# Patient Record
Sex: Male | Born: 1975 | Race: Black or African American | Hispanic: No | Marital: Single | Smoking: Current every day smoker
Health system: Southern US, Community
[De-identification: ages and names within clinical notes are randomized; demographics above are authoritative.]

---

## 2020-02-20 ENCOUNTER — Emergency Department (HOSPITAL_COMMUNITY)
Admission: EM | Admit: 2020-02-20 | Discharge: 2020-02-20 | Disposition: A | Discharge status: Home / Self Care | Payer: PRIVATE HEALTH INSURANCE | Attending: Emergency Medicine | Admitting: Emergency Medicine

## 2020-02-20 ENCOUNTER — Emergency Department (HOSPITAL_COMMUNITY): Payer: PRIVATE HEALTH INSURANCE

## 2020-02-20 ENCOUNTER — Encounter (HOSPITAL_COMMUNITY): Payer: Self-pay

## 2020-02-20 ENCOUNTER — Other Ambulatory Visit: Payer: Self-pay

## 2020-02-20 DIAGNOSIS — L089 Local infection of the skin and subcutaneous tissue, unspecified: Secondary | ICD-10-CM | POA: Diagnosis not present

## 2020-02-20 DIAGNOSIS — K0889 Other specified disorders of teeth and supporting structures: Secondary | ICD-10-CM | POA: Diagnosis present

## 2020-02-20 DIAGNOSIS — Y999 Unspecified external cause status: Secondary | ICD-10-CM | POA: Diagnosis not present

## 2020-02-20 DIAGNOSIS — S62653A Nondisplaced fracture of medial phalanx of left middle finger, initial encounter for closed fracture: Secondary | ICD-10-CM | POA: Diagnosis not present

## 2020-02-20 DIAGNOSIS — Y9389 Activity, other specified: Secondary | ICD-10-CM | POA: Insufficient documentation

## 2020-02-20 DIAGNOSIS — K047 Periapical abscess without sinus: Secondary | ICD-10-CM | POA: Insufficient documentation

## 2020-02-20 DIAGNOSIS — S62651A Nondisplaced fracture of medial phalanx of left index finger, initial encounter for closed fracture: Secondary | ICD-10-CM | POA: Diagnosis not present

## 2020-02-20 DIAGNOSIS — X58XXXA Exposure to other specified factors, initial encounter: Secondary | ICD-10-CM | POA: Insufficient documentation

## 2020-02-20 DIAGNOSIS — Y929 Unspecified place or not applicable: Secondary | ICD-10-CM | POA: Insufficient documentation

## 2020-02-20 MED ORDER — TRAMADOL HCL 50 MG PO TABS
50.0000 mg | ORAL_TABLET | Freq: Once | ORAL | Status: AC
  Administered 2020-02-20: 50 mg via ORAL
  Filled 2020-02-20: qty 1

## 2020-02-20 MED ORDER — AMOXICILLIN-POT CLAVULANATE 875-125 MG PO TABS
1.0000 | ORAL_TABLET | Freq: Once | ORAL | Status: AC
  Administered 2020-02-20: 1 via ORAL
  Filled 2020-02-20: qty 1

## 2020-02-20 MED ORDER — AMOXICILLIN-POT CLAVULANATE 875-125 MG PO TABS
1.0000 | ORAL_TABLET | Freq: Two times a day (BID) | ORAL | 0 refills | Status: DC
Start: 2020-02-20 — End: 2021-11-27

## 2020-02-20 NOTE — ED Triage Notes (Addendum)
Pt reports rash that has spread from lower left arm to his neck that started about a week and a half ago. Pt also endorses right sided dental pain that started 2-3 days ago.

## 2020-02-20 NOTE — Discharge Instructions (Addendum)
As discussed, you have been diagnosed with a skin infection, a dental infection, and a broken para fingers on her left hand. In regards to the skin infection, please follow-up with your physician.  In regards to the dental infection, please use the provided resources to ensure follow-up with a dentist.  In regards to the finger fractures it is very portly follow-up with her orthopedic colleagues within 1 week for repeat evaluation.  Return here for concerning changes in your condition.

## 2020-02-20 NOTE — ED Provider Notes (Signed)
[facility]-EMERGENCY DEPT Provider Note   CSN: [id number] Arrival date & time: 02/20/20  2031     History Chief Complaint  Patient presents with  . Dental Pain  . Rash    Austin Elliott is a 44 y.o. male.  HPI    Patient presents with multiple complaints. Seems as though his primary complaint is right facial pain, swelling.  Secondary complaint is diffuse skin lesions.  Third complaint is pain about the second and third digit on the left hand. He notes that for the past 3 or 4 days he has had increasing pain, swelling, about his right face. He has known dental issues in the right lower jaw.  He has no difficulty swallowing, speaking.  However, with increasing pain, swelling throughout the right posterior lateral face, extending into his neck he presents for evaluation. He denies fever, vomiting, nausea.  In regard to the patient's secondary complaint he notes that for about 1 week he has had widely scattered small skin lesions.  Initially these were on his arms, but subsequently spread to the rest of his torso. 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  Since that time they have been itchy, spreading, uncomfortable.  There is no confluent erythema throughout them.  In regard to the patient's third complaint he notes that about 2 weeks ago he injured his left hand, since that time he has had pain, swelling in the proximal interphalangeal joint of the second and third digits.  He is incapable of fully flexing or extending either of the digits. It is unclear if the patient has been taking any medication for pain control for any of the aforementioned maladies.  History reviewed. No pertinent past medical history.  There are no problems to display for this patient.   History reviewed. No pertinent surgical history.     History reviewed. No pertinent family history.  Social History   Tobacco Use  . Smoking status: Not on file    Substance Use Topics  . Alcohol use: Not on file  . Drug use: Not on file    Home Medications Prior to Admission medications   Not on File    Allergies    Patient has no allergy information on record.  Review of Systems   Review of Systems  Constitutional:       Per HPI, otherwise negative  HENT:       Per HPI, otherwise negative  Respiratory:       Per HPI, otherwise negative  Cardiovascular:       Per HPI, otherwise negative  Gastrointestinal: Negative for vomiting.  Endocrine:       Negative aside from HPI  Genitourinary:       Neg aside from HPI   Musculoskeletal:       Per HPI, otherwise negative  Skin: Positive for rash.  Neurological: Negative for syncope.    Physical Exam Updated Vital Signs BP (!) 167/115 (BP Location: Left Arm)   Pulse 61   Temp 98.7 F (37.1 C) (Oral)   Resp 18   Ht 5\' 6"  (1.676 m)   Wt 99.8 kg   SpO2 98%   BMI 35.51 kg/m   Physical Exam Vitals and nursing note reviewed.  Constitutional:      General: He is not in acute distress.    Appearance: He is well-developed.  HENT:     Head: Normocephalic and atraumatic.      Mouth/Throat:  Eyes:     Conjunctiva/sclera: Conjunctivae normal.  Cardiovascular:     Rate and Rhythm: Normal rate and regular rhythm.  Pulmonary:     Effort: Pulmonary effort is normal. No respiratory distress.     Breath sounds: No stridor.  Abdominal:     General: There is no distension.  Musculoskeletal:       Arms:  Skin:    General: Skin is warm and dry.     Comments: Diffuse less than 0.5 cm scattered lesions without confluent erythema.  Neurological:     Mental Status: He is alert and oriented to person, place, and time.     ED Results / Procedures / Treatments    Radiology DG Hand Complete Left  Result Date: 02/20/2020 CLINICAL DATA:  Pain to the second digits. EXAM: LEFT HAND - COMPLETE 3+ VIEW COMPARISON:  None. FINDINGS: There are acute appearing bilateral volar plate injuries  involving the base of the middle phalanges of the second and third digits. There is surrounding soft tissue swelling. There is no dislocation. IMPRESSION: Acute appearing volar plate avulsion fractures involving the proximal interphalangeal joints of the second and third digits. Electronically Signed   By: Constance Holster M.D.   On: 02/20/2020 22:29    Procedures Procedures (including critical care time)  Medications Ordered in ED Medications  amoxicillin-clavulanate (AUGMENTIN) 875-125 MG per tablet 1 tablet (1 tablet Oral Given 02/20/20 2225)  traMADol (ULTRAM) tablet 50 mg (50 mg Oral Given 02/20/20 2225)    ED Course  I have reviewed the triage vital signs and the nursing notes.  Pertinent labs & imaging results that were available during my care of the patient were reviewed by me and considered in my medical decision making (see chart for details).    MDM Rules/Calculators/A&P                      11:07 PM On repeat exam patient is awake, alert, standing upright. And reviewed the findings with him, after reviewing the images myself, interpreting.  Patient found to have 2 individual fractures in digits on the left hand.  Patient is requiring splinting, immobilization, orthopedic follow-up in this regard. In regard to his other complaints there is no evidence for airway compromise, bacteremia, sepsis, there is suspicion for both dental infection and cutaneous infection. In total, the patient will require analgesia, antibiotics, splinting, but is appropriate for outpatient follow-up in all regards. Final Clinical Impression(s) / ED Diagnoses Final diagnoses:  Dental infection  Closed nondisplaced fracture of middle phalanx of left index finger, initial encounter  Closed nondisplaced fracture of middle phalanx of left middle finger, initial encounter  Skin infection    Rx / DC Orders ED Discharge Orders         Ordered    amoxicillin-clavulanate (AUGMENTIN) 875-125 MG tablet   Every 12 hours     02/20/20 2312           Carmin Muskrat, MD 02/20/20 2312

## 2020-04-11 ENCOUNTER — Emergency Department (HOSPITAL_COMMUNITY)
Admission: EM | Admit: 2020-04-11 | Discharge: 2020-04-11 | Disposition: A | Discharge status: Home / Self Care | Payer: PRIVATE HEALTH INSURANCE | Attending: Emergency Medicine | Admitting: Emergency Medicine

## 2020-04-11 ENCOUNTER — Other Ambulatory Visit: Payer: Self-pay

## 2020-04-11 ENCOUNTER — Emergency Department (HOSPITAL_COMMUNITY): Payer: PRIVATE HEALTH INSURANCE

## 2020-04-11 ENCOUNTER — Encounter (HOSPITAL_COMMUNITY): Payer: Self-pay

## 2020-04-11 DIAGNOSIS — Y999 Unspecified external cause status: Secondary | ICD-10-CM | POA: Insufficient documentation

## 2020-04-11 DIAGNOSIS — M79642 Pain in left hand: Secondary | ICD-10-CM | POA: Insufficient documentation

## 2020-04-11 DIAGNOSIS — Y929 Unspecified place or not applicable: Secondary | ICD-10-CM | POA: Insufficient documentation

## 2020-04-11 DIAGNOSIS — Y939 Activity, unspecified: Secondary | ICD-10-CM | POA: Insufficient documentation

## 2020-04-11 DIAGNOSIS — W19XXXA Unspecified fall, initial encounter: Secondary | ICD-10-CM

## 2020-04-11 DIAGNOSIS — M79645 Pain in left finger(s): Secondary | ICD-10-CM | POA: Insufficient documentation

## 2020-04-11 DIAGNOSIS — W010XXA Fall on same level from slipping, tripping and stumbling without subsequent striking against object, initial encounter: Secondary | ICD-10-CM | POA: Insufficient documentation

## 2020-04-11 MED ORDER — NAPROXEN 500 MG PO TABS
500.0000 mg | ORAL_TABLET | Freq: Once | ORAL | Status: AC
  Administered 2020-04-11: 500 mg via ORAL
  Filled 2020-04-11: qty 1

## 2020-04-11 NOTE — ED Triage Notes (Signed)
Pt states a few days ago pt fell on his left hand, hurting his left index and middle finger. Pt able to move fingers. Pt states he wants them "checked out".

## 2020-04-11 NOTE — Discharge Instructions (Signed)
Take tylenol and Ibuprofen as needed for pain. Follow up with hand surgery for new or worsening symptoms

## 2020-04-11 NOTE — ED Provider Notes (Signed)
[facility]-EMERGENCY DEPT Provider Note   CSN: [id number] Arrival date & time: 04/11/20  1132     History Chief Complaint  Patient presents with   Left Hand Injury    Austin Elliott is a 44 y.o. male with this a past medical history who presents for evaluation of left hand pain.  Patient states 3 days ago he had a mechanical trip and fall.  He did not complete, ground however patient states his second and third digit on his left upper extremity and his wrist have been bothering him since he fell.  He states he has had some soft tissue swelling which is significantly resolved since initial incident however continues to have some pain.  He has no bony tenderness to his radius or ulna.  He denies hitting his head, LOC or anticoagulation.  No contusions, abrasions, lacerations.  Denies any IV drug use.  Denies fever, chills, nausea, vomiting, chest pain, shortness of breath, edema, erythema, warmth, paresthesias.  Denies additional aggravating or relieving factors.  History obtained from patient and past medical records. No interpreter used.  HPI     History reviewed. No pertinent past medical history.  There are no problems to display for this patient.   History reviewed. No pertinent surgical history.     History reviewed. No pertinent family history.  Social History   Tobacco Use   Smoking status: Not on file  Substance Use Topics   Alcohol use: Not on file   Drug use: Not on file    Home Medications Prior to Admission medications   Medication Sig Start Date End Date Taking? Authorizing Provider  amoxicillin-clavulanate (AUGMENTIN) 875-125 MG tablet Take 1 tablet by mouth every 12 (twelve) hours. 02/20/20   Gerhard Munch, MD    Allergies    Patient has no known allergies.  Review of Systems   Review of Systems  Constitutional: Negative.   HENT: Negative.   Respiratory: Negative.   Cardiovascular: Negative.   Gastrointestinal: Negative.    Genitourinary: Negative.   Musculoskeletal:       Left hand pain  Skin: Negative.   Neurological: Negative.   All other systems reviewed and are negative.   Physical Exam Updated Vital Signs BP (!) 144/93    Pulse 61    Temp 98.4 F (36.9 C) (Oral)    Resp 18    SpO2 100%   Physical Exam Vitals and nursing note reviewed.  Constitutional:      General: He is not in acute distress.    Appearance: He is well-developed. He is not ill-appearing, toxic-appearing or diaphoretic.  HENT:     Head: Normocephalic and atraumatic.     Nose: Nose normal.     Mouth/Throat:     Mouth: Mucous membranes are moist.     Pharynx: Oropharynx is clear.  Eyes:     Pupils: Pupils are equal, round, and reactive to light.  Cardiovascular:     Rate and Rhythm: Normal rate and regular rhythm.     Pulses: Normal pulses.     Heart sounds: Normal heart sounds.  Pulmonary:     Effort: Pulmonary effort is normal. No respiratory distress.     Breath sounds: Normal breath sounds.  Abdominal:     General: Bowel sounds are normal. There is no distension.     Palpations: Abdomen is soft.     Tenderness: There is no abdominal tenderness. There is no right CVA tenderness, left CVA tenderness, guarding or rebound.  Musculoskeletal:  General: Normal range of motion.     Right shoulder: Normal.     Left shoulder: Normal.     Right upper arm: Normal.     Left upper arm: Normal.     Right elbow: Normal.     Left elbow: Normal.     Right forearm: Normal.     Left forearm: Normal.     Right wrist: Normal.     Left wrist: Normal.     Right hand: Normal.     Left hand: Tenderness present. No swelling, lacerations or bony tenderness. Normal range of motion. Normal strength.       Hands:     Cervical back: Normal range of motion and neck supple.     Comments: Moves all  4 extremities without difficulty.  No bony tenderness to bilateral scaphoid.  No bony tenderness to radius or ulna.  Some tenderness to  his second and third digit on his left upper extremity without any overlying skin changes.  Able to flex and extend at left upper extremity.  Able to pronate and supinate.  Skin:    General: Skin is warm and dry.     Capillary Refill: Capillary refill takes less than 2 seconds.     Comments: Brisk capillary refill.  No edema, erythema or warmth.  No fluctuance or induration.  No contusions, abrasions or lacerations  Neurological:     Mental Status: He is alert.     Comments: 5/5 strength bilateral upper extremities at difficulty Intact sensation.     ED Results / Procedures / Treatments   Labs (all labs ordered are listed, but only abnormal results are displayed) Labs Reviewed - No data to display  EKG None  Radiology DG Wrist Complete Left  Result Date: 04/11/2020 CLINICAL DATA:  Left wrist pain after fall. EXAM: LEFT WRIST - COMPLETE 3+ VIEW COMPARISON:  None. FINDINGS: There is no evidence of fracture or dislocation. There is no evidence of arthropathy or other focal bone abnormality. Soft tissues are unremarkable. IMPRESSION: Negative. Electronically Signed   By: Lupita Raider M.D.   On: 04/11/2020 12:26   DG Hand Complete Left  Result Date: 04/11/2020 CLINICAL DATA:  Left hand pain after fall several days ago. EXAM: LEFT HAND - COMPLETE 3+ VIEW COMPARISON:  None. FINDINGS: There is no evidence of fracture or dislocation. There is no evidence of arthropathy or other focal bone abnormality. Soft tissues are unremarkable. IMPRESSION: Negative. Electronically Signed   By: Lupita Raider M.D.   On: 04/11/2020 12:25    Procedures .Ortho Injury Treatment  Date/Time: 04/11/2020 1:16 PM Performed by: Ralph Leyden A, PA-C Authorized by: Linwood Dibbles, PA-C   Consent:    Consent obtained:  Verbal   Consent given by:  Patient   Risks discussed:  Fracture, nerve damage, restricted joint movement, vascular damage, stiffness, recurrent dislocation and irreducible  dislocation   Alternatives discussed:  No treatment, alternative treatment, immobilization, referral and delayed treatmentInjury location: hand Location details: left hand Injury type: soft tissue Pre-procedure neurovascular assessment: neurovascularly intact Pre-procedure distal perfusion: normal Pre-procedure neurological function: normal Pre-procedure range of motion: normal  Anesthesia: Local anesthesia used: no  Patient sedated: NoImmobilization: brace Post-procedure neurovascular assessment: post-procedure neurovascularly intact Post-procedure distal perfusion: normal Post-procedure neurological function: normal Post-procedure range of motion: normal Patient tolerance: patient tolerated the procedure well with no immediate complications    (including critical care time)  Medications Ordered in ED Medications  naproxen (NAPROSYN) tablet 500 mg (500  mg Oral Given 04/11/20 1256)    ED Course  I have reviewed the triage vital signs and the nursing notes.  Pertinent labs & imaging results that were available during my care of the patient were reviewed by me and considered in my medical decision making (see chart for details).  44 year old presents for evaluation mechanical fall.  Had fall 2-3 days ago.  He denies hitting his head, LOC or anticoagulation.  Patient with left hand pain since the incident.  He is neurovascularly intact.  He has normal musculoskeletal exam.  Initially had some swelling over his second and third metacarpals and digits which has significantly resolved.  No contusions or abrasions.  Plan on x-ray and reassess.  Xray without acute findings.   Buddy taped fingers for comfort. Splint wrist. Low suspicion for acute tendon or ligament injury as well as bony injury given he has full range of motion. Low suspicion for acute tendon, ligament or bony injury given he has full range motion and is neurovascularly intact. We will have him follow-up with surgery if he  continues to have pain. Low suspicion for septic joint, gout, hemarthrosis, vascular occlusion, bacterial infectious process.  The patient has been appropriately medically screened and/or stabilized in the ED. I have low suspicion for any other emergent medical condition which would require further screening, evaluation or treatment in the ED or require inpatient management.  Patient is hemodynamically stable and in no acute distress.  Patient able to ambulate in department prior to ED.  Evaluation does not show acute pathology that would require ongoing or additional emergent interventions while in the emergency department or further inpatient treatment.  I have discussed the diagnosis with the patient and answered all questions.  Pain is been managed while in the emergency department and patient has no further complaints prior to discharge.  Patient is comfortable with plan discussed in room and is stable for discharge at this time.  I have discussed strict return precautions for returning to the emergency department.  Patient was encouraged to follow-up with PCP/specialist refer to at discharge.    MDM Rules/Calculators/A&P                           Final Clinical Impression(s) / ED Diagnoses Final diagnoses:  Fall, initial encounter  Pain of left hand    Rx / DC Orders ED Discharge Orders    None       Rai Severns A, PA-C 04/11/20 1317    Terrilee Files, MD 04/11/20 1755

## 2020-04-11 NOTE — ED Notes (Signed)
An After Visit Summary was printed and given to the patient. Discharge instructions given and no further questions at this time.  

## 2020-08-18 ENCOUNTER — Emergency Department (HOSPITAL_COMMUNITY)
Admission: EM | Admit: 2020-08-18 | Discharge: 2020-08-18 | Disposition: A | Discharge status: Home / Self Care | Payer: Self-pay | Attending: Emergency Medicine | Admitting: Emergency Medicine

## 2020-08-18 ENCOUNTER — Emergency Department (HOSPITAL_COMMUNITY): Payer: Self-pay

## 2020-08-18 ENCOUNTER — Other Ambulatory Visit: Payer: Self-pay

## 2020-08-18 ENCOUNTER — Encounter (HOSPITAL_COMMUNITY): Payer: Self-pay

## 2020-08-18 DIAGNOSIS — F17200 Nicotine dependence, unspecified, uncomplicated: Secondary | ICD-10-CM | POA: Insufficient documentation

## 2020-08-18 DIAGNOSIS — R1010 Upper abdominal pain, unspecified: Secondary | ICD-10-CM | POA: Insufficient documentation

## 2020-08-18 DIAGNOSIS — R110 Nausea: Secondary | ICD-10-CM | POA: Insufficient documentation

## 2020-08-18 DIAGNOSIS — M549 Dorsalgia, unspecified: Secondary | ICD-10-CM | POA: Insufficient documentation

## 2020-08-18 LAB — COMPREHENSIVE METABOLIC PANEL
ALT: 39 U/L (ref 0–44)
AST: 27 U/L (ref 15–41)
Albumin: 4.1 g/dL (ref 3.5–5.0)
Alkaline Phosphatase: 62 U/L (ref 38–126)
Anion gap: 9 (ref 5–15)
BUN: 6 mg/dL (ref 6–20)
CO2: 25 mmol/L (ref 22–32)
Calcium: 9 mg/dL (ref 8.9–10.3)
Chloride: 103 mmol/L (ref 98–111)
Creatinine, Ser: 0.93 mg/dL (ref 0.61–1.24)
GFR, Estimated: >60 mL/min (ref >60)
Glucose, Bld: 87 mg/dL (ref 70–99)
Potassium: 3.8 mmol/L (ref 3.5–5.1)
Sodium: 137 mmol/L (ref 135–145)
Total Bilirubin: 0.6 mg/dL (ref 0.3–1.2)
Total Protein: 7.6 g/dL (ref 6.5–8.1)

## 2020-08-18 LAB — CBC
HCT: 42.9 % (ref 39.0–52.0)
Hemoglobin: 14.6 g/dL (ref 13.0–17.0)
MCH: 30.9 pg (ref 26.0–34.0)
MCHC: 34 g/dL (ref 30.0–36.0)
MCV: 90.7 fL (ref 80.0–100.0)
Platelets: 247 10*3/uL (ref 150–400)
RBC: 4.73 MIL/uL (ref 4.22–5.81)
RDW: 12.2 % (ref 11.5–15.5)
WBC: 4.4 10*3/uL (ref 4.0–10.5)
nRBC: 0 % (ref 0.0–0.2)

## 2020-08-18 LAB — URINALYSIS, ROUTINE W REFLEX MICROSCOPIC
Bilirubin Urine: NEGATIVE
Glucose, UA: NEGATIVE mg/dL
Hgb urine dipstick: NEGATIVE
Ketones, ur: NEGATIVE mg/dL
Leukocytes,Ua: NEGATIVE
Nitrite: NEGATIVE
Protein, ur: NEGATIVE mg/dL
Specific Gravity, Urine: 1.019 (ref 1.005–1.030)
pH: 9 — ABNORMAL HIGH (ref 5.0–8.0)

## 2020-08-18 LAB — LIPASE, BLOOD: Lipase: 36 U/L (ref 11–51)

## 2020-08-18 LAB — POC OCCULT BLOOD, ED: Fecal Occult Bld: NEGATIVE

## 2020-08-18 MED ORDER — PANTOPRAZOLE SODIUM 20 MG PO TBEC
20.0000 mg | DELAYED_RELEASE_TABLET | Freq: Every day | ORAL | 0 refills | Status: DC
Start: 2020-08-18 — End: 2020-08-18

## 2020-08-18 MED ORDER — PANTOPRAZOLE SODIUM 20 MG PO TBEC: 20.0000 mg | DELAYED_RELEASE_TABLET | Freq: Every day | ORAL | 0 refills | Status: AC

## 2020-08-18 MED ORDER — IOHEXOL 300 MG/ML  SOLN
100.0000 mL | Freq: Once | INTRAMUSCULAR | Status: AC | PRN
  Administered 2020-08-18: 100 mL via INTRAVENOUS

## 2020-08-18 MED ORDER — ALUM & MAG HYDROXIDE-SIMETH 200-200-20 MG/5ML PO SUSP
30.0000 mL | Freq: Once | ORAL | Status: AC
  Administered 2020-08-18: 30 mL via ORAL
  Filled 2020-08-18: qty 30

## 2020-08-18 MED ORDER — LIDOCAINE VISCOUS HCL 2 % MT SOLN
15.0000 mL | Freq: Once | OROMUCOSAL | Status: AC
  Administered 2020-08-18: 15 mL via ORAL
  Filled 2020-08-18: qty 15

## 2020-08-18 MED ORDER — SODIUM CHLORIDE 0.9 % IV BOLUS
1000.0000 mL | Freq: Once | INTRAVENOUS | Status: AC
  Administered 2020-08-18: 1000 mL via INTRAVENOUS

## 2020-08-18 NOTE — ED Triage Notes (Signed)
Pt presents with c/o abdominal pain for 5 days. Pt reports he has also had diarrhea.

## 2020-08-18 NOTE — ED Provider Notes (Signed)
[facility]-EMERGENCY DEPT Provider Note   CSN: [id number] Arrival date & time: 08/18/20  1120     History Chief Complaint  Patient presents with  . Abdominal Pain    Austin Elliott is a 44 y.o. male.  44 year old male with a significant past medical history presents with complaint of pain from umbilical area to epigastric area for the past 4 to 5 days.  Pain is constant, somewhat improved with eating.  Patient denies NSAID use, is not anticoagulated.  States pain sometimes radiates into his back.  Denies chest pain or shortness of breath.  Patient reports nausea, denies vomiting, no changes in bladder habits.  Patient states that his stools have been black and sticky, has been taking Pepto-Bismol, unsure if related.  Other complaints or concerns.    Daily smoker. Occasional alcohol use, last drank Friday night.     History reviewed. No pertinent past medical history.  There are no problems to display for this patient.   History reviewed. No pertinent surgical history.     History reviewed. No pertinent family history.  Social History   Tobacco Use  . Smoking status: Not on file  Substance Use Topics  . Alcohol use: Not on file  . Drug use: Not on file    Home Medications Prior to Admission medications   Medication Sig Start Date End Date Taking? Authorizing Provider  amoxicillin-clavulanate (AUGMENTIN) 875-125 MG tablet Take 1 tablet by mouth every 12 (twelve) hours. 02/20/20   Gerhard Munch, MD  pantoprazole (PROTONIX) 20 MG tablet Take 1 tablet (20 mg total) by mouth daily. 08/18/20   Jeannie Fend, PA-C    Allergies    Patient has no known allergies.  Review of Systems   Review of Systems  Constitutional: Negative for chills and fever.  Respiratory: Negative for shortness of breath.   Cardiovascular: Negative for chest pain.  Gastrointestinal: Positive for abdominal pain and nausea. Negative for constipation, diarrhea and vomiting.    Genitourinary: Negative for dysuria and frequency.  Musculoskeletal: Positive for back pain. Negative for arthralgias and myalgias.  Skin: Negative for rash and wound.  Allergic/Immunologic: Negative for immunocompromised state.  Neurological: Negative for weakness.  Hematological: Does not bruise/bleed easily.  Psychiatric/Behavioral: Negative for confusion.  All other systems reviewed and are negative.   Physical Exam Updated Vital Signs BP (!) 130/95   Pulse (!) 49   Temp 98.5 F (36.9 C) (Oral)   Resp 18   SpO2 98%   Physical Exam Vitals and nursing note reviewed. Exam conducted with a chaperone present.  Constitutional:      General: He is not in acute distress.    Appearance: He is well-developed. He is not diaphoretic.  HENT:     Head: Normocephalic and atraumatic.  Cardiovascular:     Rate and Rhythm: Normal rate and regular rhythm.     Heart sounds: Normal heart sounds.  Pulmonary:     Effort: Pulmonary effort is normal.     Breath sounds: Normal breath sounds.  Abdominal:     Palpations: Abdomen is soft.     Tenderness: There is abdominal tenderness in the epigastric area. There is no right CVA tenderness or left CVA tenderness.     Hernia: No hernia is present.  Musculoskeletal:     Thoracic back: No tenderness or bony tenderness.     Lumbar back: No tenderness or bony tenderness.  Skin:    General: Skin is warm and dry.  Coloration: Skin is not pale.     Findings: No erythema or rash.  Neurological:     Mental Status: He is alert and oriented to person, place, and time.  Psychiatric:        Behavior: Behavior normal.     ED Results / Procedures / Treatments   Labs (all labs ordered are listed, but only abnormal results are displayed) Labs Reviewed  URINALYSIS, ROUTINE W REFLEX MICROSCOPIC - Abnormal; Notable for the following components:      Result Value   pH 9.0 (*)    All other components within normal limits  LIPASE, BLOOD  COMPREHENSIVE  METABOLIC PANEL  CBC  POC OCCULT BLOOD, ED    EKG None  Radiology CT Abdomen Pelvis W Contrast  Result Date: 08/18/2020 CLINICAL DATA:  Nonlocalized mid to lower abdominal pain for the past 5 days. EXAM: CT ABDOMEN AND PELVIS WITH CONTRAST TECHNIQUE: Multidetector CT imaging of the abdomen and pelvis was performed using the standard protocol following bolus administration of intravenous contrast. CONTRAST:  OMNIPAQUE IOHEXOL 300 MG/ML  SOLN COMPARISON:  None. FINDINGS: Lower chest: Unremarkable. Hepatobiliary: Small lateral segment left lobe liver cyst. Small, small oval area of low density in the periphery of the right lobe of the liver, possibly representing an additional small cyst. Normal appearing gallbladder. Pancreas: Unremarkable. No pancreatic ductal dilatation or surrounding inflammatory changes. Spleen: Normal in size without focal abnormality. Adrenals/Urinary Tract: Adrenal glands are unremarkable. Kidneys are normal, without renal calculi, focal lesion, or hydronephrosis. Bladder is unremarkable. Stomach/Bowel: Several small appendicoliths in the distal appendix without evidence of appendicitis. Unremarkable stomach, small bowel and colon. Vascular/Lymphatic: No significant vascular findings are present. No enlarged abdominal or pelvic lymph nodes. Reproductive: Mildly enlarged prostate gland. Other: Small umbilical hernia containing fat. Musculoskeletal: Minimal thoracolumbar kyphosis. Small vacuum phenomenon in the L5-S1 disc with mild to moderate posterior disc protrusion. IMPRESSION: 1. No acute abnormality. 2. Several small appendicoliths in the distal appendix without evidence of appendicitis. Electronically Signed   By: Beckie Salts M.D.   On: 08/18/2020 13:46    Procedures Procedures (including critical care time)  Medications Ordered in ED Medications  alum & mag hydroxide-simeth (MAALOX/MYLANTA) 200-200-20 MG/5ML suspension 30 mL (has no administration in time range)      And  lidocaine (XYLOCAINE) 2 % viscous mouth solution 15 mL (has no administration in time range)  sodium chloride 0.9 % bolus 1,000 mL (1,000 mLs Intravenous New Bag/Given (Non-Interop) 08/18/20 1246)  iohexol (OMNIPAQUE) 300 MG/ML solution 100 mL (100 mLs Intravenous Contrast Given 08/18/20 1314)    ED Course  I have reviewed the triage vital signs and the nursing notes.  Pertinent labs & imaging results that were available during my care of the patient were reviewed by me and considered in my medical decision making (see chart for details).  Clinical Course as of Aug 18 1356  Fri Aug 18, 2020  437 44 year old male with complaint of abdominal pain and dark stool. On exam, well appearing, mild epigastric tenderness on exam, stool is dark but is hemoccult negative, suspect dark stools from the pepto bismal he is taking. Labs reassuring including CBC, CMP, lipase, UA. Vitals stable. Waiting for CT abdomen and pelvis. Consider PUD, gastritis, pancreatitis, GERD.   [LM]  1350 CT abdomen/pelvis without significant findings to suggest cause of pain. Plan is to give GI cocktail, likely dc with Protonix and GI referral.    [LM]    Clinical Course User Index [LM]  Alden Hipp   MDM Rules/Calculators/A&P                          Final Clinical Impression(s) / ED Diagnoses Final diagnoses:  Pain of upper abdomen    Rx / DC Orders ED Discharge Orders         Ordered    pantoprazole (PROTONIX) 20 MG tablet  Daily        08/18/20 1356           Alden Hipp 08/18/20 1357    Terald Sleeper, MD 08/18/20 1421

## 2020-08-18 NOTE — Discharge Instructions (Addendum)
Take Protonix daily as prescribed. Stop smoking and avoid alcohol as these things can irritate your stomach. Follow-up with GI as discussed, referral given. Return to ER for worsening or concerning symptoms.

## 2021-01-17 ENCOUNTER — Encounter (HOSPITAL_COMMUNITY): Payer: Self-pay

## 2021-01-17 ENCOUNTER — Emergency Department (HOSPITAL_COMMUNITY)
Admission: EM | Admit: 2021-01-17 | Discharge: 2021-01-17 | Disposition: A | Discharge status: Home / Self Care | Payer: Self-pay | Attending: Emergency Medicine | Admitting: Emergency Medicine

## 2021-01-17 ENCOUNTER — Other Ambulatory Visit: Payer: Self-pay

## 2021-01-17 DIAGNOSIS — K0889 Other specified disorders of teeth and supporting structures: Secondary | ICD-10-CM | POA: Insufficient documentation

## 2021-01-17 DIAGNOSIS — R220 Localized swelling, mass and lump, head: Secondary | ICD-10-CM | POA: Insufficient documentation

## 2021-01-17 DIAGNOSIS — F17200 Nicotine dependence, unspecified, uncomplicated: Secondary | ICD-10-CM | POA: Insufficient documentation

## 2021-01-17 MED ORDER — KETOROLAC TROMETHAMINE 60 MG/2ML IM SOLN
60.0000 mg | Freq: Once | INTRAMUSCULAR | Status: AC
  Administered 2021-01-17: 60 mg via INTRAMUSCULAR
  Filled 2021-01-17: qty 2

## 2021-01-17 MED ORDER — AMOXICILLIN 500 MG PO CAPS
500.0000 mg | ORAL_CAPSULE | Freq: Three times a day (TID) | ORAL | 0 refills | Status: DC
Start: 2021-01-17 — End: 2021-11-27

## 2021-01-17 MED ORDER — NAPROXEN 375 MG PO TABS: 375.0000 mg | ORAL_TABLET | Freq: Two times a day (BID) | ORAL | 0 refills | Status: AC

## 2021-01-17 NOTE — ED Provider Notes (Signed)
[facility]-EMERGENCY DEPT Provider Note   CSN: [id number] Arrival date & time: 01/17/21  1434     History Chief Complaint  Patient presents with  . Dental Pain    Austin Elliott is a 45 y.o. male.  Patient presents with 5 days of worsening right lower tooth pain. Patient with known cracked tooth. Mild facial swelling. Patient is saving up money for dental visit.  The history is provided by the patient. No language interpreter was used.  Dental Pain Location:  Lower Quality:  Aching and throbbing Severity:  Severe Onset quality:  Gradual Duration:  5 days Timing:  Constant Progression:  Worsening Chronicity:  Recurrent Context: dental caries and dental fracture   Associated symptoms: facial swelling and gum swelling   Associated symptoms: no difficulty swallowing, no drooling, no fever and no trismus        History reviewed. No pertinent past medical history.  There are no problems to display for this patient.   History reviewed. No pertinent surgical history.     History reviewed. No pertinent family history.  Social History   Tobacco Use  . Smoking status: Current Every Day Smoker  . Smokeless tobacco: Never Used    Home Medications Prior to Admission medications   Medication Sig Start Date End Date Taking? Authorizing Provider  amoxicillin-clavulanate (AUGMENTIN) 875-125 MG tablet Take 1 tablet by mouth every 12 (twelve) hours. 02/20/20   Gerhard Munch, MD  pantoprazole (PROTONIX) 20 MG tablet Take 1 tablet (20 mg total) by mouth daily. 08/18/20   Jeannie Fend, PA-C    Allergies    Patient has no known allergies.  Review of Systems   Review of Systems  Constitutional: Negative for fever.  HENT: Positive for facial swelling. Negative for drooling.     Physical Exam Updated Vital Signs BP (!) 151/96 (BP Location: Left Arm)   Pulse (!) 55   Temp 98.7 F (37.1 C) (Oral)   Resp 16   SpO2 98%   Physical Exam Vitals and  nursing note reviewed.  HENT:     Head: Normocephalic.     Nose: Nose normal.     Mouth/Throat:     Mouth: Mucous membranes are moist.      Comments: Broken tooth with cavity, gingival swelling Eyes:     Conjunctiva/sclera: Conjunctivae normal.  Cardiovascular:     Rate and Rhythm: Normal rate.  Pulmonary:     Effort: Pulmonary effort is normal.  Abdominal:     Palpations: Abdomen is soft.  Musculoskeletal:        General: Normal range of motion.     Cervical back: Normal range of motion.  Skin:    General: Skin is warm and dry.  Neurological:     Mental Status: He is alert and oriented to person, place, and time.  Psychiatric:        Mood and Affect: Mood normal.        Behavior: Behavior normal.     ED Results / Procedures / Treatments   Labs (all labs ordered are listed, but only abnormal results are displayed) Labs Reviewed - No data to display  EKG None  Radiology No results found.  Procedures Procedures   Medications Ordered in ED Medications  ketorolac (TORADOL) injection 60 mg (60 mg Intramuscular Given 01/17/21 1533)    ED Course  I have reviewed the triage vital signs and the nursing notes.  Pertinent labs & imaging results that were available during my care  of the patient were reviewed by me and considered in my medical decision making (see chart for details).    MDM Rules/Calculators/A&P                          Patient with dentalgia.  No abscess requiring immediate incision and drainage.  Exam not concerning for Ludwig's angina or pharyngeal abscess.  Will treat with amoxicillin and naproxen. Pt instructed to follow-up with dentist.  Discussed return precautions. Pt safe for discharge. Final Clinical Impression(s) / ED Diagnoses Final diagnoses:  Dentalgia    Rx / DC Orders ED Discharge Orders         Ordered    amoxicillin (AMOXIL) 500 MG capsule  3 times daily        01/17/21 1525    naproxen (NAPROSYN) 375 MG tablet  2 times daily         01/17/21 1525           Felicie Morn, NP 01/17/21 1558    Rolan Bucco, MD 01/17/21 2210

## 2021-01-17 NOTE — Discharge Instructions (Addendum)
Take medications as directed. Please follow-up with dentistry as soon as able.

## 2021-01-17 NOTE — ED Triage Notes (Signed)
Pt arrived via POV, c/o right sided dental pain x4-5 days, worsening today. States the tooth cracked and concern for infection. Has not been able tot see dentist yet. Took tylenol with little relief.

## 2021-02-19 ENCOUNTER — Other Ambulatory Visit: Payer: Self-pay

## 2021-02-19 ENCOUNTER — Encounter (HOSPITAL_COMMUNITY): Payer: Self-pay

## 2021-02-19 ENCOUNTER — Emergency Department (HOSPITAL_COMMUNITY)
Admission: EM | Admit: 2021-02-19 | Discharge: 2021-02-19 | Disposition: A | Discharge status: Home / Self Care | Payer: Self-pay | Attending: Emergency Medicine | Admitting: Emergency Medicine

## 2021-02-19 DIAGNOSIS — F17200 Nicotine dependence, unspecified, uncomplicated: Secondary | ICD-10-CM | POA: Insufficient documentation

## 2021-02-19 DIAGNOSIS — T7840XA Allergy, unspecified, initial encounter: Secondary | ICD-10-CM | POA: Insufficient documentation

## 2021-02-19 DIAGNOSIS — L250 Unspecified contact dermatitis due to cosmetics: Secondary | ICD-10-CM

## 2021-02-19 DIAGNOSIS — L232 Allergic contact dermatitis due to cosmetics: Secondary | ICD-10-CM | POA: Insufficient documentation

## 2021-02-19 MED ORDER — FAMOTIDINE IN NACL 20-0.9 MG/50ML-% IV SOLN
20.0000 mg | Freq: Once | INTRAVENOUS | Status: AC
  Administered 2021-02-19: 20 mg via INTRAVENOUS
  Filled 2021-02-19: qty 50

## 2021-02-19 MED ORDER — DIPHENHYDRAMINE HCL 50 MG/ML IJ SOLN
25.0000 mg | Freq: Once | INTRAMUSCULAR | Status: AC
  Administered 2021-02-19: 25 mg via INTRAVENOUS
  Filled 2021-02-19: qty 1

## 2021-02-19 MED ORDER — METHYLPREDNISOLONE SODIUM SUCC 125 MG IJ SOLR
125.0000 mg | Freq: Once | INTRAMUSCULAR | Status: AC
  Administered 2021-02-19: 125 mg via INTRAVENOUS
  Filled 2021-02-19: qty 2

## 2021-02-19 MED ORDER — FAMOTIDINE 20 MG PO TABS
20.0000 mg | ORAL_TABLET | Freq: Two times a day (BID) | ORAL | 0 refills | Status: AC
Start: 2021-02-19 — End: ?

## 2021-02-19 MED ORDER — PREDNISONE 20 MG PO TABS: 60.0000 mg | ORAL_TABLET | Freq: Every day | ORAL | 0 refills | Status: AC

## 2021-02-19 MED ORDER — ONDANSETRON HCL 4 MG/2ML IJ SOLN
4.0000 mg | Freq: Once | INTRAMUSCULAR | Status: DC
  Filled 2021-02-19: qty 2

## 2021-02-19 NOTE — ED Notes (Signed)
ED Provider at bedside. 

## 2021-02-19 NOTE — Discharge Instructions (Addendum)
Please avoid using any box hair dye in the future.  Take prednisone once daily for the next 5 days starting tomorrow.  Take Pepcid twice daily and use Benadryl 25 mg every 6 hours.  Continue these medications for at least the next 5 days.  It will likely take at least a week for contact dermatitis to completely resolved.  Use a gentle nonirritating shampoo as needed.  Follow-up with your PCP if symptoms not resolving.

## 2021-02-19 NOTE — ED Triage Notes (Addendum)
Pt reports allergic reaction to box hair dye. Pt used hair dye on scalp and face covering beard last night. Pt admits to waking up to facial swelling, scalp swelling, throat discomfort and difficulty swallowing.

## 2021-02-19 NOTE — ED Provider Notes (Signed)
[facility]-EMERGENCY DEPT Provider Note   CSN: [id number] Arrival date & time: 02/19/21  6979     History Chief Complaint  Patient presents with  . Allergic Reaction    Austin Elliott is a 45 y.o. male.  Austin Elliott is a 45 y.o. male with no pertinent past medical history, presents with allergic reaction to box hair dye. Patient applied dye to head and beard on Saturday night, woke up Sunday with some burning and discomfort in those areas. He shampooed the areas but noticed throughout the day his face and scalp looked swollen. Pt woke up this morning with some discomfort in the throat and difficulty swallowing. Rash and swelling only presents with places that came in contact with dye. Patient had similar reaction to box dye 8-9 years ago without associated throat swelling. Thought this dye would be ok because it was supposed to be organic. He took 2 doses of benadryl with minimal relief. Denies shortness of breath, nausea and vomiting. No prior history of anaphylaxis.        History reviewed. No pertinent past medical history.  There are no problems to display for this patient.   History reviewed. No pertinent surgical history.     No family history on file.  Social History   Tobacco Use  . Smoking status: Current Every Day Smoker  . Smokeless tobacco: Never Used    Home Medications Prior to Admission medications   Medication Sig Start Date End Date Taking? Authorizing Provider  amoxicillin (AMOXIL) 500 MG capsule Take 1 capsule (500 mg total) by mouth 3 (three) times daily. 01/17/21   Felicie Morn, NP  amoxicillin-clavulanate (AUGMENTIN) 875-125 MG tablet Take 1 tablet by mouth every 12 (twelve) hours. 02/20/20   Gerhard Munch, MD  naproxen (NAPROSYN) 375 MG tablet Take 1 tablet (375 mg total) by mouth 2 (two) times daily. 01/17/21   Felicie Morn, NP  pantoprazole (PROTONIX) 20 MG tablet Take 1 tablet (20 mg total) by mouth daily. 08/18/20   Jeannie Fend, PA-C    Allergies    Patient has no known allergies.  Review of Systems   Review of Systems  Constitutional: Negative for chills and fever.  HENT: Positive for facial swelling. Negative for trouble swallowing.   Respiratory: Negative for shortness of breath, wheezing and stridor.   Gastrointestinal: Negative for nausea and vomiting.  Skin: Positive for rash.  All other systems reviewed and are negative.   Physical Exam Updated Vital Signs BP (!) 144/103 (BP Location: Right Arm)   Pulse 76   Temp 98.7 F (37.1 C) (Oral)   Resp 18   Ht 5\' 6"  (1.676 m)   Wt 99.8 kg   SpO2 100%   BMI 35.51 kg/m   Physical Exam Vitals and nursing note reviewed.  Constitutional:      General: He is not in acute distress.    Appearance: Normal appearance. He is well-developed and normal weight. He is not ill-appearing or diaphoretic.  HENT:     Head: Normocephalic and atraumatic.     Comments: Scalp with swelling and rash with vesicular lesions and some weeping with serous fluid, no areas of abscess, or pustules, similar rash also present in the distribution of patient's beard with swelling noted.    Mouth/Throat:     Mouth: Mucous membranes are moist.     Pharynx: Oropharynx is clear.     Comments: Posterior oropharynx moist and clear, no edema noted, tolerating secretions, normal phonation, no stridor  Eyes:     General:        Right eye: No discharge.        Left eye: No discharge.  Cardiovascular:     Rate and Rhythm: Normal rate and regular rhythm.  Pulmonary:     Effort: Pulmonary effort is normal. No respiratory distress.     Breath sounds: Normal breath sounds.  Musculoskeletal:     Cervical back: Neck supple.  Skin:    General: Skin is warm and dry.     Findings: Rash present.     Comments: Rash present over the scalp and beard, but does not extend elsewhere  Neurological:     Mental Status: He is alert and oriented to person, place, and time.     Coordination:  Coordination normal.  Psychiatric:        Mood and Affect: Mood normal.        Behavior: Behavior normal.     ED Results / Procedures / Treatments   Labs (all labs ordered are listed, but only abnormal results are displayed) Labs Reviewed - No data to display  EKG None  Radiology No results found.  Procedures Procedures   Medications Ordered in ED Medications  famotidine (PEPCID) IVPB 20 mg premix (has no administration in time range)  methylPREDNISolone sodium succinate (SOLU-MEDROL) 125 mg/2 mL injection 125 mg (has no administration in time range)  diphenhydrAMINE (BENADRYL) injection 25 mg (has no administration in time range)    ED Course  I have reviewed the triage vital signs and the nursing notes.  Pertinent labs & imaging results that were available during my care of the patient were reviewed by me and considered in my medical decision making (see chart for details).    MDM Rules/Calculators/A&P                         Rash consistent with contact dermatitis. Patient denies any difficulty breathing or swallowing.  Pt has a patent airway without stridor and is handling secretions without difficulty; no angioedema. No blisters, no pustules, no warmth, no draining sinus tracts, no superficial abscesses, no bullous impetigo, no vesicles, no desquamation, no target lesions with dusky purpura or a central bulla. Not tender to touch. No concern for superimposed infection. No concern for SJS, TEN, TSS, tick borne illness, syphilis or other life-threatening condition.  Symptoms treated and improving here in the ED.  Will discharge home with short course of steroids, pepcid and recommend Benadryl as needed for pruritis.  Pt's blood pressure was elevated today, pt is not exhibiting any symptoms to suggest hypertensive urgency or emergency today, will have pt follow up with their PCP in 1 week for blood pressure check.   Final Clinical Impression(s) / ED Diagnoses Final  diagnoses:  Allergic reaction, initial encounter  Contact dermatitis due to cosmetics, unspecified contact dermatitis type    Rx / DC Orders ED Discharge Orders         Ordered    predniSONE (DELTASONE) 20 MG tablet  Daily        02/19/21 0844    famotidine (PEPCID) 20 MG tablet  2 times daily        02/19/21 0844           Dartha Lodge, PA-C 02/19/21 0900    Benjiman Core, MD 02/20/21 [phone]

## 2021-02-19 NOTE — ED Notes (Signed)
Pt is no longer nauseous. Provided with juice and crackers.

## 2021-03-13 ENCOUNTER — Encounter (HOSPITAL_COMMUNITY): Payer: Self-pay | Admitting: *Deleted

## 2021-03-13 ENCOUNTER — Emergency Department (HOSPITAL_COMMUNITY)
Admission: EM | Admit: 2021-03-13 | Discharge: 2021-03-13 | Discharge status: Against Medical Advice | Payer: Self-pay | Attending: Emergency Medicine | Admitting: Emergency Medicine

## 2021-03-13 ENCOUNTER — Other Ambulatory Visit: Payer: Self-pay

## 2021-03-13 DIAGNOSIS — R42 Dizziness and giddiness: Secondary | ICD-10-CM | POA: Insufficient documentation

## 2021-03-13 DIAGNOSIS — Z5321 Procedure and treatment not carried out due to patient leaving prior to being seen by health care provider: Secondary | ICD-10-CM | POA: Insufficient documentation

## 2021-03-13 DIAGNOSIS — W228XXA Striking against or struck by other objects, initial encounter: Secondary | ICD-10-CM | POA: Insufficient documentation

## 2021-03-13 DIAGNOSIS — R110 Nausea: Secondary | ICD-10-CM | POA: Insufficient documentation
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
## 2021-03-13 NOTE — ED Notes (Signed)
Called for triage x3, no answer. 

## 2021-03-16 ENCOUNTER — Emergency Department (HOSPITAL_COMMUNITY): Payer: Self-pay

## 2021-03-16 ENCOUNTER — Emergency Department (HOSPITAL_COMMUNITY)
Admission: EM | Admit: 2021-03-16 | Discharge: 2021-03-16 | Disposition: A | Discharge status: Home / Self Care | Payer: Self-pay | Attending: Emergency Medicine | Admitting: Emergency Medicine

## 2021-03-16 ENCOUNTER — Encounter (HOSPITAL_COMMUNITY): Payer: Self-pay

## 2021-03-16 DIAGNOSIS — S0990XA Unspecified injury of head, initial encounter: Secondary | ICD-10-CM

## 2021-03-16 DIAGNOSIS — S060X0A Concussion without loss of consciousness, initial encounter: Secondary | ICD-10-CM | POA: Insufficient documentation

## 2021-03-16 DIAGNOSIS — W228XXA Striking against or struck by other objects, initial encounter: Secondary | ICD-10-CM | POA: Insufficient documentation

## 2021-03-16 DIAGNOSIS — Y9389 Activity, other specified: Secondary | ICD-10-CM | POA: Insufficient documentation

## 2021-03-16 DIAGNOSIS — Y92511 Restaurant or cafe as the place of occurrence of the external cause: Secondary | ICD-10-CM | POA: Insufficient documentation

## 2021-03-16 DIAGNOSIS — F17200 Nicotine dependence, unspecified, uncomplicated: Secondary | ICD-10-CM | POA: Insufficient documentation
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
## 2021-05-31 ENCOUNTER — Encounter (HOSPITAL_COMMUNITY): Payer: Self-pay

## 2021-05-31 ENCOUNTER — Other Ambulatory Visit: Payer: Self-pay

## 2021-05-31 ENCOUNTER — Emergency Department (HOSPITAL_COMMUNITY)
Admission: EM | Admit: 2021-05-31 | Discharge: 2021-05-31 | Disposition: A | Discharge status: Home / Self Care | Payer: Self-pay | Attending: Emergency Medicine | Admitting: Emergency Medicine

## 2021-05-31 DIAGNOSIS — K0889 Other specified disorders of teeth and supporting structures: Secondary | ICD-10-CM

## 2021-05-31 DIAGNOSIS — F17210 Nicotine dependence, cigarettes, uncomplicated: Secondary | ICD-10-CM | POA: Insufficient documentation

## 2021-05-31 DIAGNOSIS — K029 Dental caries, unspecified: Secondary | ICD-10-CM | POA: Insufficient documentation

## 2021-05-31 MED ORDER — KETOROLAC TROMETHAMINE 15 MG/ML IJ SOLN
30.0000 mg | Freq: Once | INTRAMUSCULAR | Status: AC
  Administered 2021-05-31: 30 mg via INTRAMUSCULAR
  Filled 2021-05-31: qty 2

## 2021-05-31 MED ORDER — AMOXICILLIN-POT CLAVULANATE 875-125 MG PO TABS: 1.0000 | ORAL_TABLET | Freq: Two times a day (BID) | ORAL | 0 refills | Status: DC

## 2021-05-31 MED ORDER — NAPROXEN 500 MG PO TABS: 500.0000 mg | ORAL_TABLET | Freq: Two times a day (BID) | ORAL | 0 refills | Status: AC

## 2021-05-31 MED ORDER — AMOXICILLIN-POT CLAVULANATE 875-125 MG PO TABS
1.0000 | ORAL_TABLET | Freq: Once | ORAL | Status: AC
  Administered 2021-05-31: 1 via ORAL
  Filled 2021-05-31: qty 1

## 2021-05-31 NOTE — Discharge Instructions (Addendum)
It was a pleasure taking care of you today.  As discussed, I did not see an abscess during my exam.  I suspect your swelling is related to an infection.  I am sending you home with an antibiotic and pain medication.  Take antibiotics for 7 days.  I have included dental resources in the community.  Please call today to schedule an appointment for further evaluation.  Return to the ER for new or worsening symptoms.

## 2021-05-31 NOTE — ED Provider Notes (Signed)
[facility]-EMERGENCY DEPT Provider Note   CSN: [id number] Arrival date & time: 05/31/21  1321     History Chief Complaint  Patient presents with   Dental Pain   Facial Swelling    Austin Elliott is a 45 y.o. male with no significant past medical history who presents to the ED due to right lower dental pain and facial edema that started earlier this morning.  Patient has a history of poor dentition and a chipped right lower tooth which he has had issues with in the past.  Pain worse with mastication.  No fever or chills.  No difficulty swallowing or breathing.  No trismus.  No changes to phonation.  Patient has not seen a dentist in numerous years.  Chart reviewed.  Patient was seen a few months ago for the same complaint.  No treatment prior to arrival.  No aggravating or alleviating factors.  History obtained from patient and past medical records. No interpreter used during encounter.       History reviewed. No pertinent past medical history.  There are no problems to display for this patient.   History reviewed. No pertinent surgical history.     Family History  Problem Relation Age of Onset   Diabetes Mother     Social History   Tobacco Use   Smoking status: Every Day    Packs/day: 0.50    Types: Cigarettes   Smokeless tobacco: Never  Vaping Use   Vaping Use: Never used  Substance Use Topics   Alcohol use: Yes   Drug use: Never    Home Medications Prior to Admission medications   Medication Sig Start Date End Date Taking? Authorizing Provider  amoxicillin-clavulanate (AUGMENTIN) 875-125 MG tablet Take 1 tablet by mouth every 12 (twelve) hours. 05/31/21  Yes Angeligue Bowne C, PA-C  naproxen (NAPROSYN) 500 MG tablet Take 1 tablet (500 mg total) by mouth 2 (two) times daily. 05/31/21  Yes Heily Carlucci, Merla Riches, PA-C  amoxicillin (AMOXIL) 500 MG capsule Take 1 capsule (500 mg total) by mouth 3 (three) times daily. 01/17/21   Felicie Morn, NP   amoxicillin-clavulanate (AUGMENTIN) 875-125 MG tablet Take 1 tablet by mouth every 12 (twelve) hours. 02/20/20   Gerhard Munch, MD  famotidine (PEPCID) 20 MG tablet Take 1 tablet (20 mg total) by mouth 2 (two) times daily. 02/19/21   Dartha Lodge, PA-C  naproxen (NAPROSYN) 375 MG tablet Take 1 tablet (375 mg total) by mouth 2 (two) times daily. 01/17/21   Felicie Morn, NP  pantoprazole (PROTONIX) 20 MG tablet Take 1 tablet (20 mg total) by mouth daily. 08/18/20   Jeannie Fend, PA-C    Allergies    Patient has no known allergies.  Review of Systems   Review of Systems  Constitutional:  Negative for chills and fever.  HENT:  Positive for dental problem and facial swelling. Negative for sore throat and trouble swallowing.   All other systems reviewed and are negative.  Physical Exam Updated Vital Signs BP (!) 159/72 (BP Location: Right Arm)   Pulse 95   Temp 98.6 F (37 C) (Oral)   Resp 16   Ht 5\' 7"  (1.702 m)   Wt 95.3 kg   SpO2 99%   BMI 32.89 kg/m   Physical Exam Vitals and nursing note reviewed.  Constitutional:      General: He is not in acute distress.    Appearance: He is not ill-appearing.  HENT:     Head: Normocephalic.  Mouth/Throat:     Comments: Poor dentition throughout.  Right lower posterior molar chipped.  Numerous dental caries.  Mild right-sided facial edema.  No trismus.  Normal phonation.  Patient tolerating oral secretions without difficulty.  No abscess.  Tongue in normal position without protrusion. Eyes:     Pupils: Pupils are equal, round, and reactive to light.  Cardiovascular:     Rate and Rhythm: Normal rate and regular rhythm.     Pulses: Normal pulses.     Heart sounds: Normal heart sounds. No murmur heard.   No friction rub. No gallop.  Pulmonary:     Effort: Pulmonary effort is normal.     Breath sounds: Normal breath sounds.  Abdominal:     General: Abdomen is flat. There is no distension.     Palpations: Abdomen is soft.      Tenderness: There is no abdominal tenderness. There is no guarding or rebound.  Musculoskeletal:        General: Normal range of motion.     Cervical back: Neck supple.  Skin:    General: Skin is warm and dry.  Neurological:     General: No focal deficit present.     Mental Status: He is alert.  Psychiatric:        Mood and Affect: Mood normal.        Behavior: Behavior normal.    ED Results / Procedures / Treatments   Labs (all labs ordered are listed, but only abnormal results are displayed) Labs Reviewed - No data to display  EKG None  Radiology No results found.  Procedures Procedures   Medications Ordered in ED Medications  ketorolac (TORADOL) 15 MG/ML injection 30 mg (has no administration in time range)  amoxicillin-clavulanate (AUGMENTIN) 875-125 MG per tablet 1 tablet (has no administration in time range)    ED Course  I have reviewed the triage vital signs and the nursing notes.  Pertinent labs & imaging results that were available during my care of the patient were reviewed by me and considered in my medical decision making (see chart for details).    MDM Rules/Calculators/A&P                          45 year old male presents to the ED due to right-sided lower dental pain that started earlier today associated with mild facial edema.  No fever or chills.  Upon arrival, patient afebrile, not tachycardic or hypoxic.  Patient in no acute distress.  Poor dentition throughout with a chipped right lower posterior molar.  Mild facial edema.  No trismus.  Normal phonation.  Patient tolerating oral secretions without difficulty.  No abscess appreciated on exam.  Tongue in normal position without protrusion.  Low suspicion for Ludwigs or deep space infection.  Patient given Toradol and Augmentin here in the ED.  Patient discharged with naproxen and Augmentin.  Dental resources given to patient.  Advised patient to call dentist today to schedule appoint for further  evaluation. Strict ED precautions discussed with patient. Patient states understanding and agrees to plan. Patient discharged home in no acute distress and stable vitals  Final Clinical Impression(s) / ED Diagnoses Final diagnoses:  Pain, dental    Rx / DC Orders ED Discharge Orders          Ordered    amoxicillin-clavulanate (AUGMENTIN) 875-125 MG tablet  Every 12 hours        05/31/21 1404    naproxen (NAPROSYN)  500 MG tablet  2 times daily        05/31/21 1404             Jesusita Oka 05/31/21 1406    Bethann Berkshire, MD 06/04/21 [phone]

## 2021-05-31 NOTE — ED Triage Notes (Signed)
Patient c/o right lower dental pain and c/o facial selling that was noted when he woke this AM. Patient denies any problems swallowing, but difficulty chewing.

## 2021-11-27 ENCOUNTER — Other Ambulatory Visit: Payer: Self-pay

## 2021-11-27 ENCOUNTER — Encounter (HOSPITAL_COMMUNITY): Payer: Self-pay

## 2021-11-27 ENCOUNTER — Emergency Department (HOSPITAL_COMMUNITY)
Admission: EM | Admit: 2021-11-27 | Discharge: 2021-11-27 | Disposition: A | Discharge status: Home / Self Care | Payer: Self-pay | Attending: Student | Admitting: Student

## 2021-11-27 DIAGNOSIS — J069 Acute upper respiratory infection, unspecified: Secondary | ICD-10-CM | POA: Insufficient documentation

## 2021-11-27 DIAGNOSIS — J3489 Other specified disorders of nose and nasal sinuses: Secondary | ICD-10-CM | POA: Insufficient documentation

## 2021-11-27 DIAGNOSIS — K0889 Other specified disorders of teeth and supporting structures: Secondary | ICD-10-CM | POA: Insufficient documentation

## 2021-11-27 DIAGNOSIS — Z20822 Contact with and (suspected) exposure to covid-19: Secondary | ICD-10-CM | POA: Insufficient documentation

## 2021-11-27 DIAGNOSIS — R040 Epistaxis: Secondary | ICD-10-CM | POA: Insufficient documentation

## 2021-11-27 LAB — RESP PANEL BY RT-PCR (FLU A&B, COVID) ARPGX2
Influenza A by PCR: NEGATIVE
Influenza B by PCR: NEGATIVE
SARS Coronavirus 2 by RT PCR: NEGATIVE

## 2021-11-27 MED ORDER — AMOXICILLIN-POT CLAVULANATE 875-125 MG PO TABS: 1.0000 | ORAL_TABLET | Freq: Two times a day (BID) | ORAL | 0 refills | Status: AC

## 2021-11-27 NOTE — ED Triage Notes (Signed)
Patient c/o  right lower dental pain. Patient reports that he has been having blood from his nose when he blows his nose.

## 2021-11-27 NOTE — Discharge Instructions (Signed)
Your symptoms are likely caused by a viral upper respiratory infection. Antibiotics are not helpful in treating viral infection, the virus should run its course in about 5-7 days. Please make sure you are drinking plenty of fluids. You can treat your symptoms supportively with tylenol/ibuprofen for fevers and pains, Zyrtec and Flonase to help with nasal congestion, and over the counter cough syrups and throat lozenges to help with cough. If your symptoms are not improving please follow up with you Primary doctor.   It is not uncommon to have nosebleeds in the setting of a viral upper respiratory infection.  If your nosebleed with pressure for at least 10 minutes and this should stop the nosebleed if you continue having persistent nosebleeds return for recheck.  If you develop persistent fevers, shortness of breath or difficulty breathing, chest pain, severe headache and neck pain, persistent nausea and vomiting or other new or concerning symptoms return to the Emergency department.

## 2021-11-27 NOTE — ED Provider Notes (Signed)
[facility] DEPT Provider Note   CSN: [id number] Arrival date & time: 11/27/21  1430     History  Chief Complaint  Patient presents with   Dental Pain   Epistaxis    Eston Herrington is a 46 y.o. male.  Cordara Bagby is a 46 y.o. male who is otherwise healthy, presents to the ED with 2-day history of nasal congestion, sinus pressure and nosebleeds.  Patient reports he has had 3 nosebleeds that were brief and easily stopped by holding pressure or applying tissues as nasal packing.  Patient has not had any fevers or chills.  He denies any cough or sore throat.  No known sick contacts.  No chest pain or shortness of breath.  No vomiting or diarrhea.  Patient also reports ongoing issues with his right lower molar, he needs to have this pulled but is waiting for his dental insurance to kick in.  Over the past week he has had increasing pain and swelling surrounding this treatment.  Has required antibiotics for this previously.  He has not taken any over-the-counter medications for nasal congestion.  The history is provided by the patient.      Home Medications Prior to Admission medications   Medication Sig Start Date End Date Taking? Authorizing Provider  amoxicillin-clavulanate (AUGMENTIN) 875-125 MG tablet Take 1 tablet by mouth every 12 (twelve) hours. 11/27/21   Jacqlyn Larsen, PA-C  famotidine (PEPCID) 20 MG tablet Take 1 tablet (20 mg total) by mouth 2 (two) times daily. 02/19/21   Jacqlyn Larsen, PA-C  naproxen (NAPROSYN) 375 MG tablet Take 1 tablet (375 mg total) by mouth 2 (two) times daily. 01/17/21   Etta Quill, NP  naproxen (NAPROSYN) 500 MG tablet Take 1 tablet (500 mg total) by mouth 2 (two) times daily. 05/31/21   Suzy Bouchard, PA-C  pantoprazole (PROTONIX) 20 MG tablet Take 1 tablet (20 mg total) by mouth daily. 08/18/20   Tacy Learn, PA-C      Allergies    Patient has no known allergies.    Review of Systems   Review of Systems   Constitutional:  Negative for chills and fever.  HENT:  Positive for congestion, dental problem, nosebleeds, rhinorrhea and sinus pressure. Negative for facial swelling and sore throat.   Respiratory:  Negative for cough and shortness of breath.   Cardiovascular:  Negative for chest pain.  Gastrointestinal:  Negative for abdominal pain, diarrhea, nausea and vomiting.  All other systems reviewed and are negative.  Physical Exam Updated Vital Signs BP 138/88 (BP Location: Left Arm)    Pulse 60    Temp 98.2 F (36.8 C) (Oral)    Resp 18    SpO2 98%  Physical Exam Vitals and nursing note reviewed.  Constitutional:      General: He is not in acute distress.    Appearance: Normal appearance. He is well-developed. He is not ill-appearing or diaphoretic.  HENT:     Head: Normocephalic and atraumatic.     Right Ear: Tympanic membrane and ear canal normal.     Left Ear: Tympanic membrane and ear canal normal.     Nose: Congestion and rhinorrhea present.     Comments: Bilateral nasal congestion and rhinorrhea noted on exam, no active bleeding noted some right-sided sinus tenderness on palpation    Mouth/Throat:     Comments: Posterior oropharynx clear without erythema, edema or exudates, uvula midline.  Right lower posterior molar broken off at the gumline with  evident decay and surrounding erythema, no drainable dental abscess noted, no sublingual tenderness or induration, normal phonation, tolerating secretions Eyes:     General:        Right eye: No discharge.        Left eye: No discharge.  Cardiovascular:     Rate and Rhythm: Normal rate and regular rhythm.  Pulmonary:     Effort: Pulmonary effort is normal. No respiratory distress.     Breath sounds: Normal breath sounds.  Musculoskeletal:        General: No deformity.     Cervical back: Neck supple. No rigidity.  Lymphadenopathy:     Cervical: No cervical adenopathy.  Skin:    General: Skin is warm and dry.  Neurological:      Mental Status: He is alert and oriented to person, place, and time.     Coordination: Coordination normal.  Psychiatric:        Mood and Affect: Mood normal.        Behavior: Behavior normal.    ED Results / Procedures / Treatments   Labs (all labs ordered are listed, but only abnormal results are displayed) Labs Reviewed  RESP PANEL BY RT-PCR (FLU A&B, COVID) [id number]    EKG None  Radiology No results found.  Procedures Procedures    Medications Ordered in ED Medications - No data to display  ED Course/ Medical Decision Making/ A&P                           Medical Decision Making  46 year old male presents with 2 days of nasal congestion and rhinorrhea with a few brief episodes of epistaxis that were resolved at home.  No nose bleeding on arrival.  Has some associated right sinus pressure, afebrile and well-appearing.  No associated cough or sore throat.  Patient also with dental pain surrounding tooth that is broken at the gumline that he has not been able to get pulled yet.  No current drainable abscess.  COVID and flu testing sent and patient will be called with any positive results.  Given the patient has clear lungs without productive cough, tachypnea or hypoxia I have low suspicion for pneumonia, do not feel that chest x-ray is indicated.    We will treat for potential dental infection with Augmentin and have patient follow-up with dentist.  Discussed over-the-counter medications to treat for viral URI.  Do not feel the patient requires any additional emergent work-up or evaluation.  Do not feel that other lab work or imaging would change management at this time.  At this time feel patient is appropriate for discharge home with continued supportive care and antibiotics for dental infection.  Patient expresses understanding and agreement.  Discharged home in good condition.        Final Clinical Impression(s) / ED Diagnoses Final diagnoses:  Viral URI  Epistaxis   Pain, dental    Rx / DC Orders ED Discharge Orders          Ordered    amoxicillin-clavulanate (AUGMENTIN) 875-125 MG tablet  Every 12 hours        11/27/21 1517              Jacqlyn Larsen, [place] 11/27/21 1539    Kommor, Debe Coder, MD 11/27/21 1611

## 2022-04-09 IMAGING — CR DG WRIST COMPLETE 3+V*L*
4 series · 4 of 4 positions shown · non-contrast
Comparison: None.

CLINICAL DATA: Left wrist pain after fall.

EXAM:
LEFT WRIST - COMPLETE 3+ VIEW

[x wrist pa left]
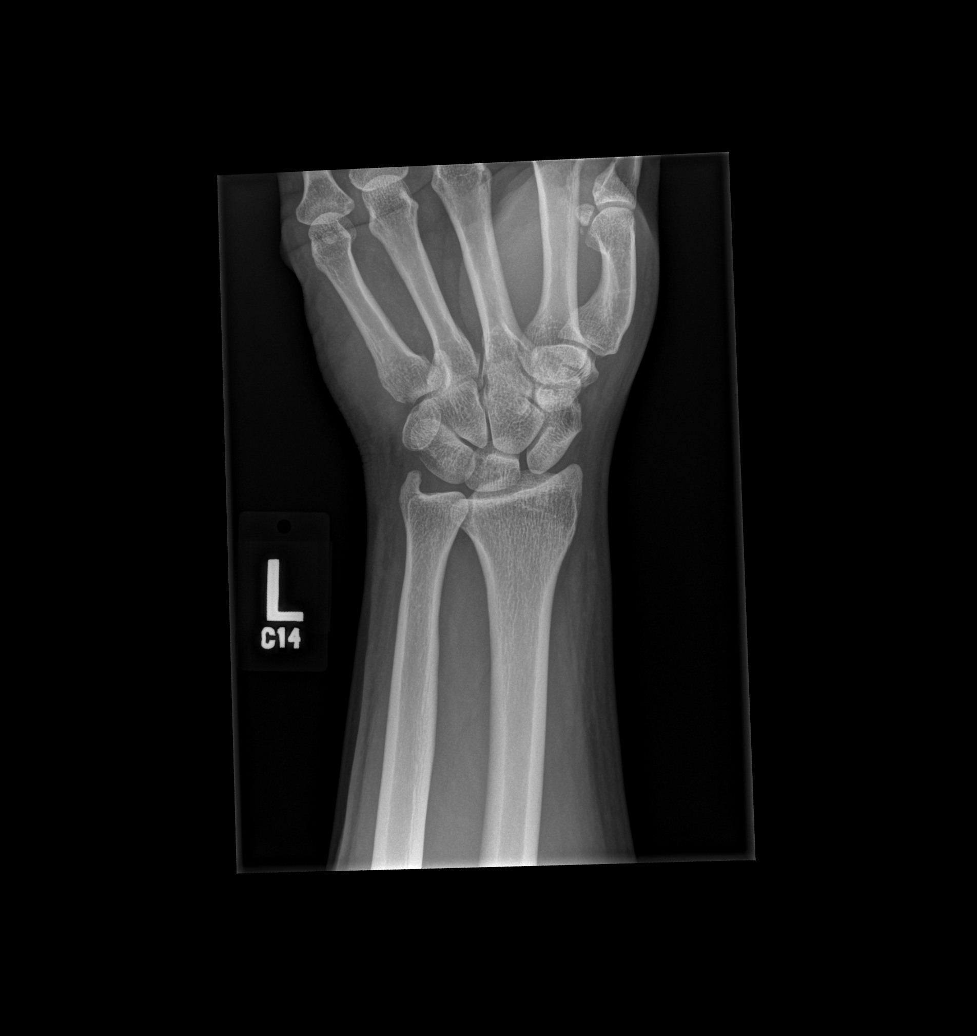

[x wrist obl left]
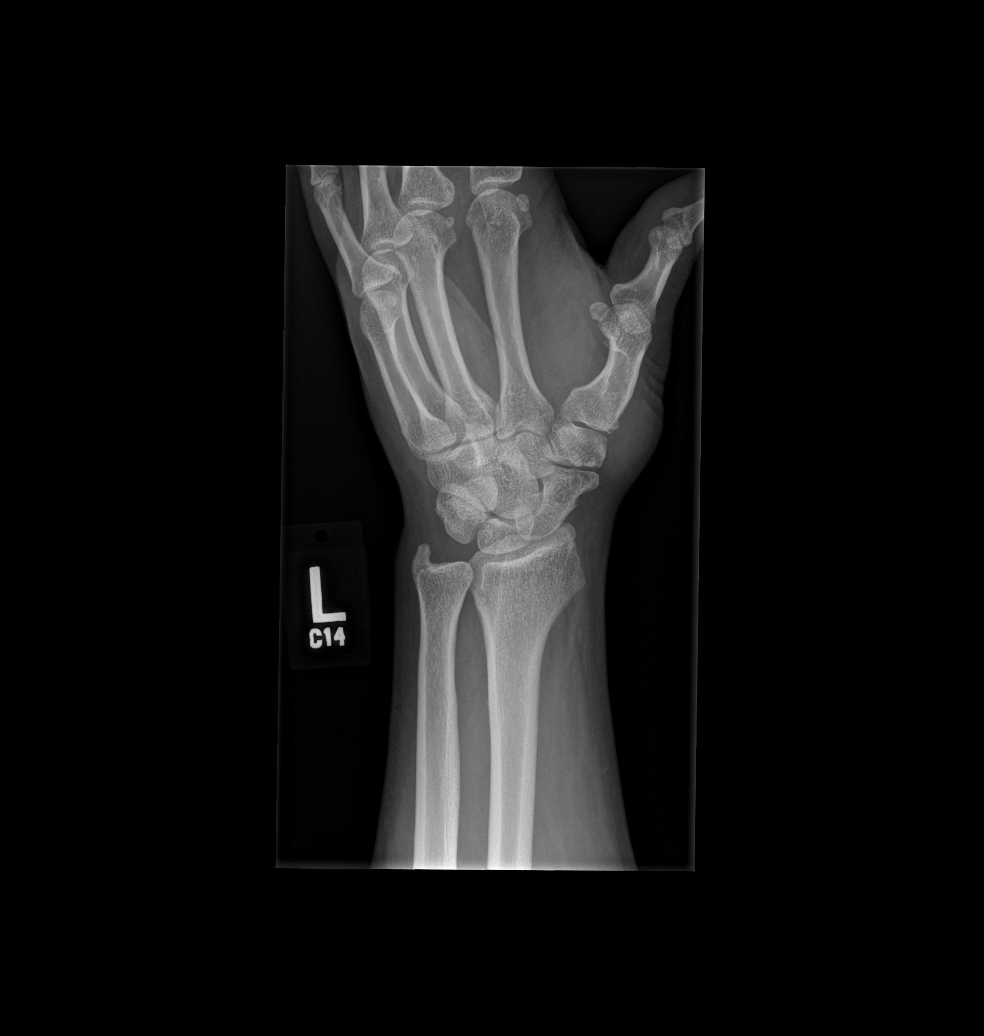

[x wrist lat left]
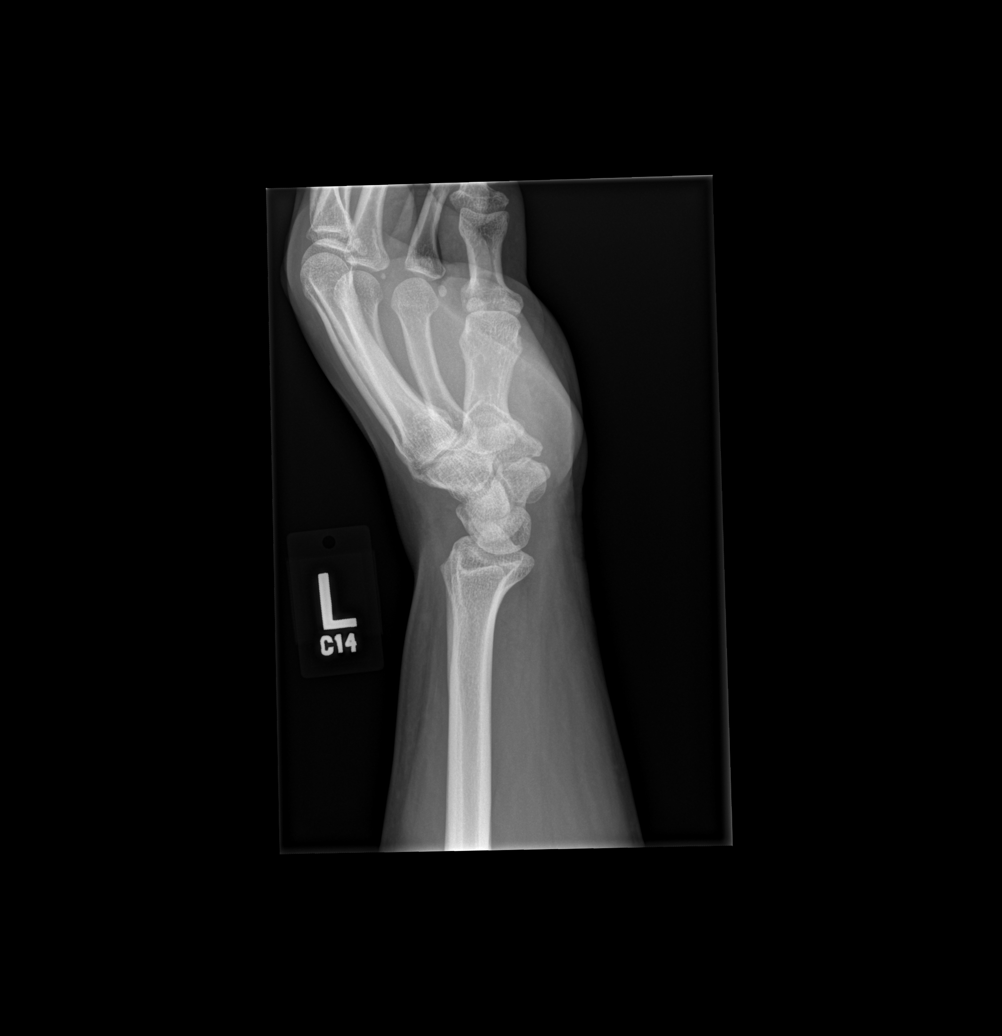

[x wrist navicular view left]
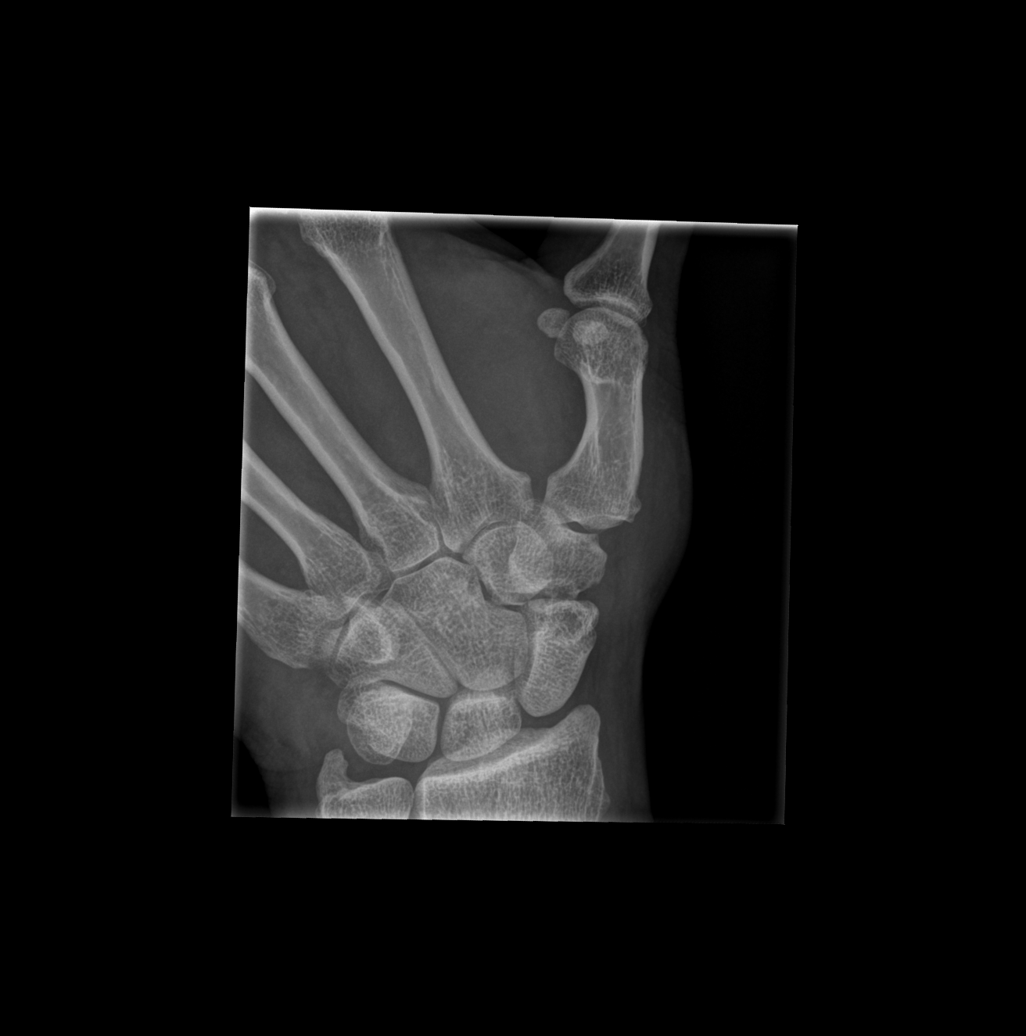

[4 of 4 positions shown; findings below may reference images not displayed]

FINDINGS: There is no evidence of fracture or dislocation. There is no
evidence of arthropathy or other focal bone abnormality. Soft
tissues are unremarkable.
IMPRESSION: Negative.

## 2022-04-09 IMAGING — CR DG HAND COMPLETE 3+V*L*
3 series · 3 of 3 positions shown · non-contrast
Comparison: None.

CLINICAL DATA: Left hand pain after fall several days ago.

EXAM:
LEFT HAND - COMPLETE 3+ VIEW

[x hand pa left]
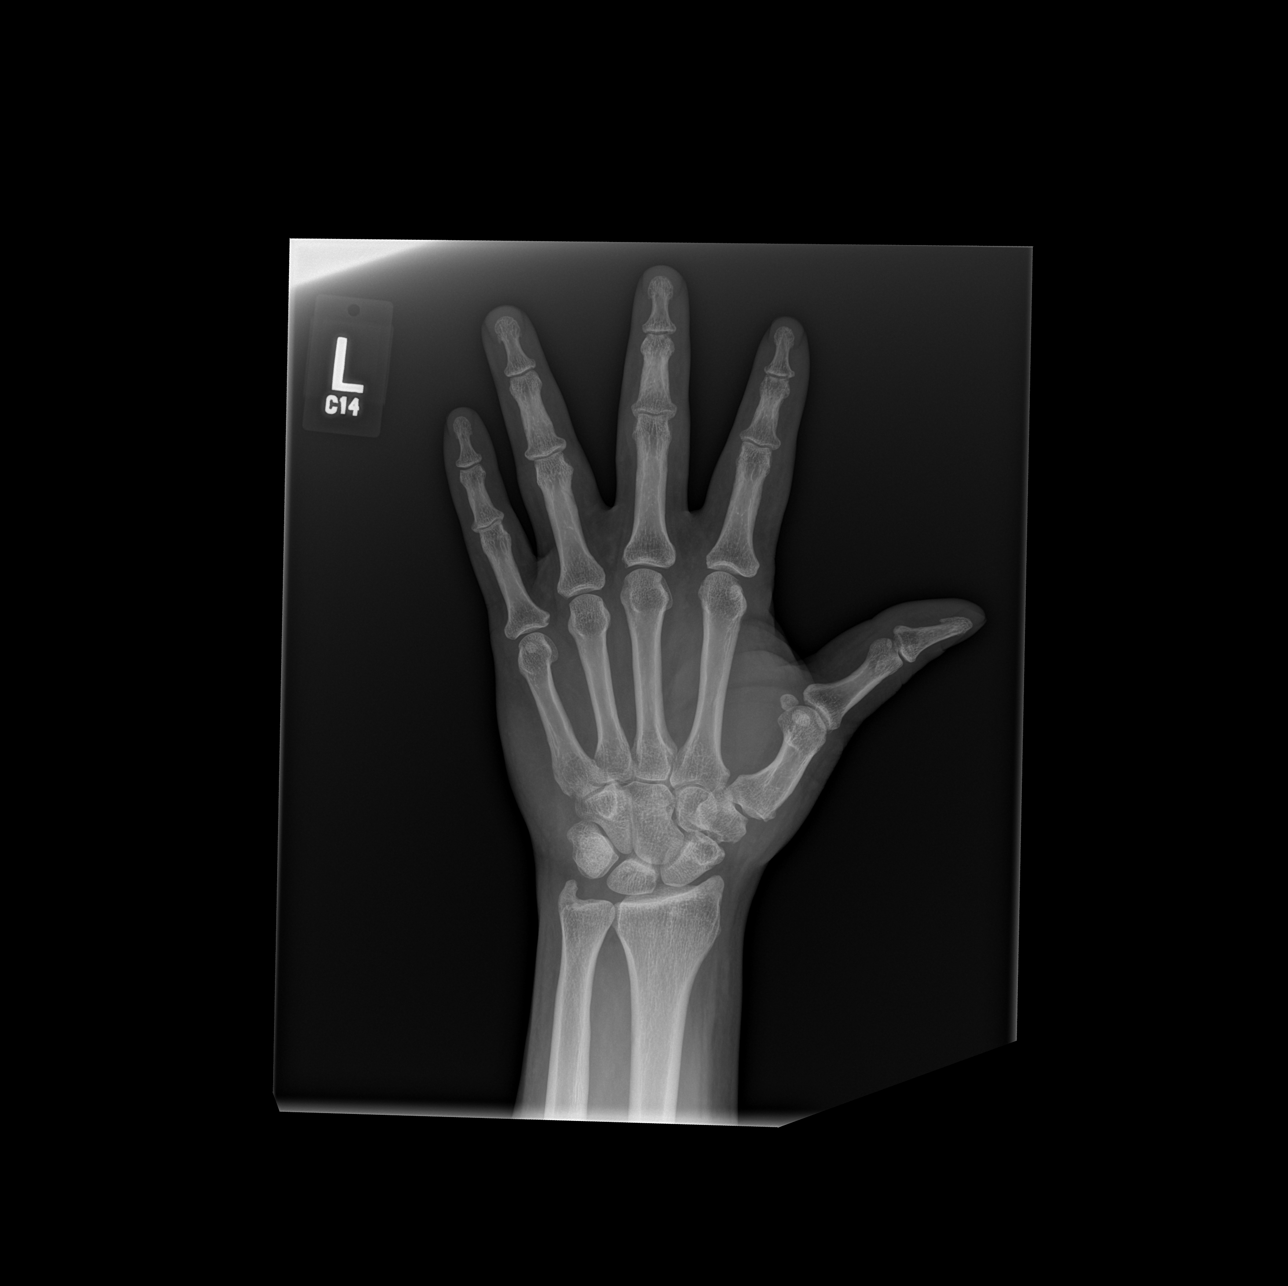

[x hand obl left]
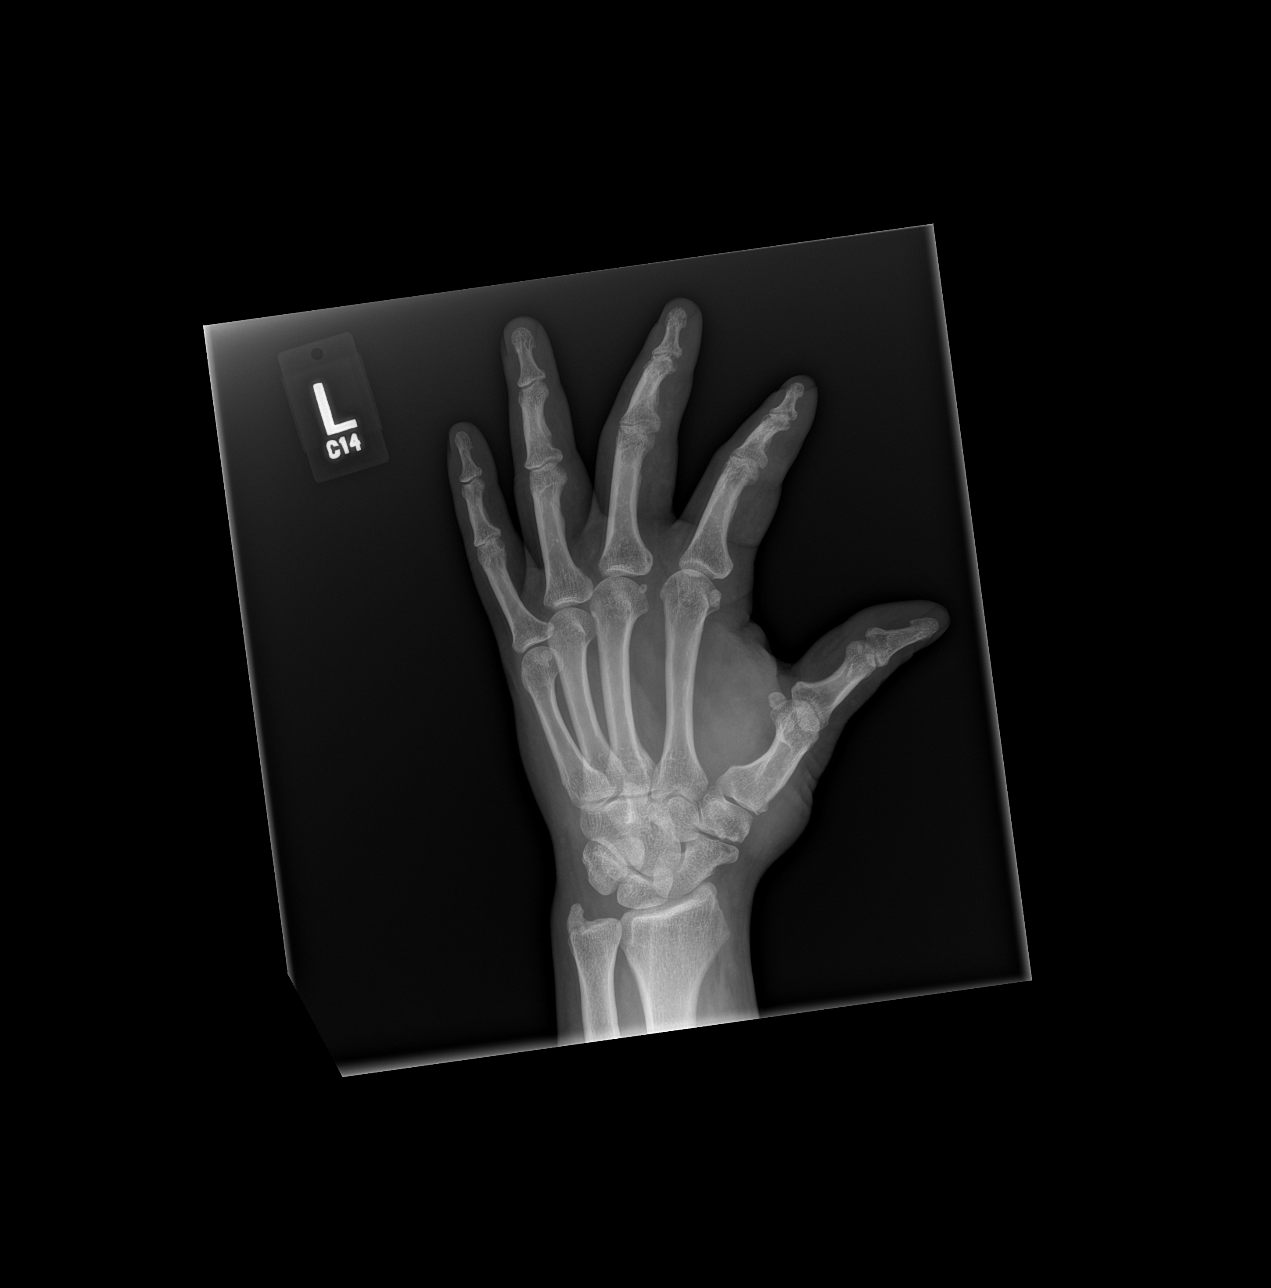

[x hand lat left]
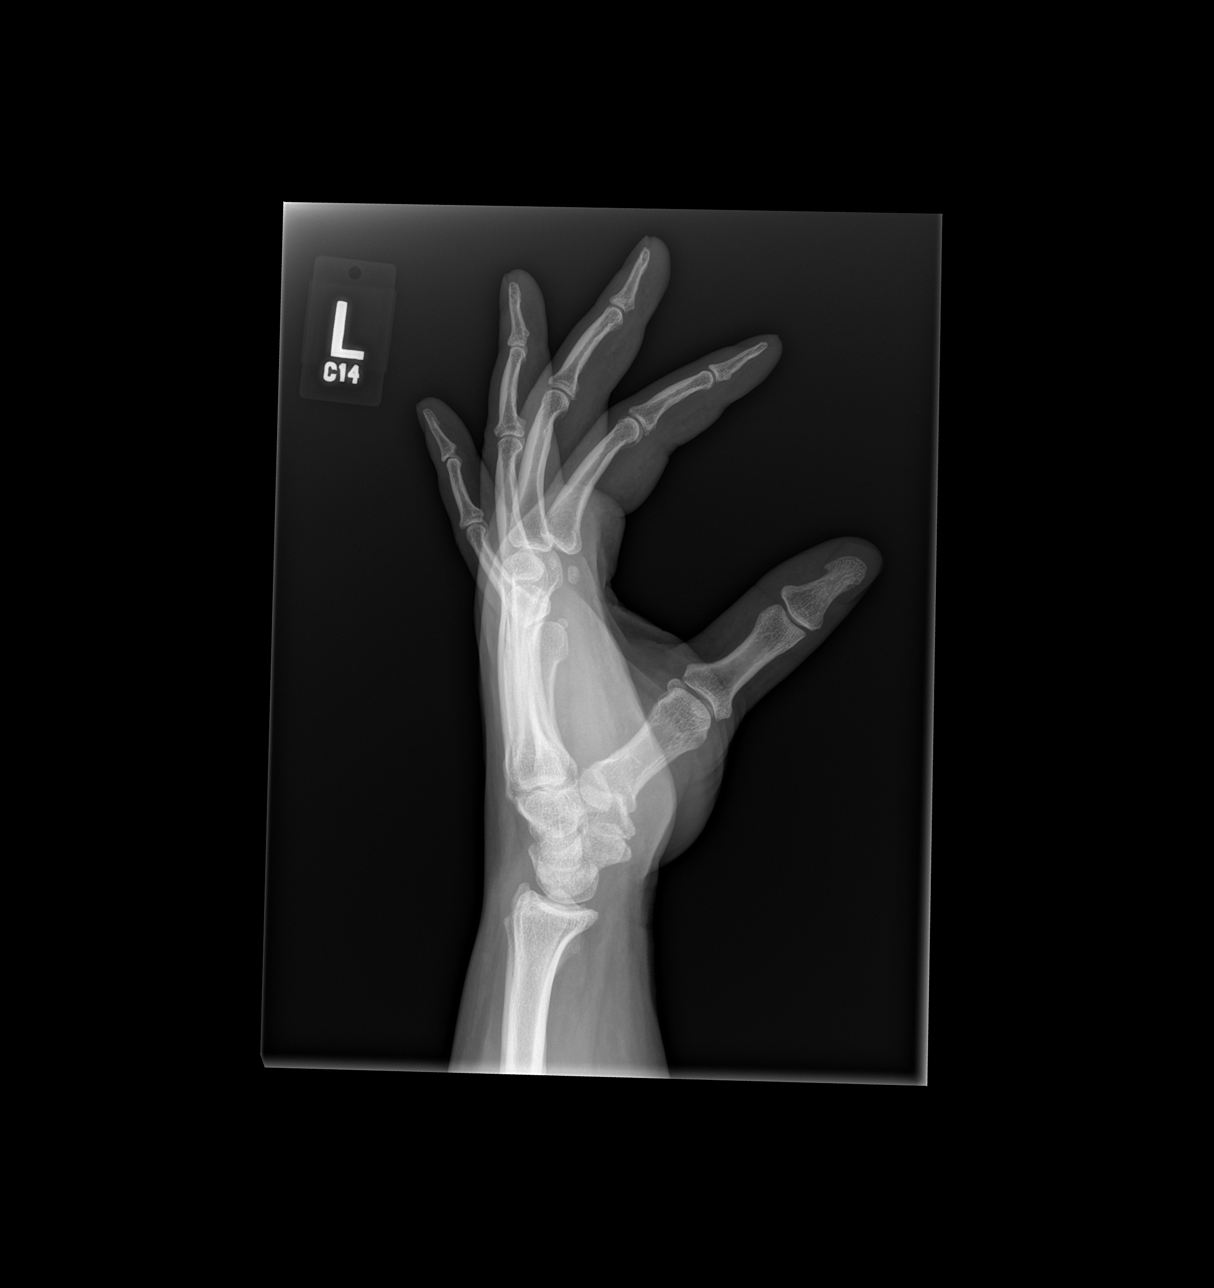

[3 of 3 positions shown; findings below may reference images not displayed]

FINDINGS: There is no evidence of fracture or dislocation. There is no
evidence of arthropathy or other focal bone abnormality. Soft
tissues are unremarkable.
IMPRESSION: Negative.
